# Patient Record
Sex: Male | Born: 1980 | Race: White | Hispanic: No | Marital: Married | Smoking: Former smoker
Health system: Southern US, Community
[De-identification: ages and names within clinical notes are randomized; demographics above are authoritative.]

## PROBLEM LIST (undated history)

## (undated) DIAGNOSIS — R748 Abnormal levels of other serum enzymes: Secondary | ICD-10-CM

## (undated) DIAGNOSIS — E119 Type 2 diabetes mellitus without complications: Secondary | ICD-10-CM

## (undated) DIAGNOSIS — R079 Chest pain, unspecified: Secondary | ICD-10-CM

## (undated) DIAGNOSIS — F320 Major depressive disorder, single episode, mild: Secondary | ICD-10-CM

## (undated) DIAGNOSIS — Z6841 Body Mass Index (BMI) 40.0 and over, adult: Secondary | ICD-10-CM

## (undated) HISTORY — DX: Chest pain, unspecified: R07.9

## (undated) HISTORY — DX: Major depressive disorder, single episode, mild: F32.0

## (undated) HISTORY — DX: Body Mass Index (BMI) 40.0 and over, adult: Z684

## (undated) HISTORY — DX: Abnormal levels of other serum enzymes: R74.8

## (undated) HISTORY — DX: Type 2 diabetes mellitus without complications: E11.9

---
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
## 2010-02-16 ENCOUNTER — Emergency Department (HOSPITAL_COMMUNITY)
Admission: EM | Admit: 2010-02-16 | Discharge: 2010-02-16 | Payer: Self-pay | Source: Home / Self Care | Admitting: Emergency Medicine

## 2015-07-15 DIAGNOSIS — Z1389 Encounter for screening for other disorder: Secondary | ICD-10-CM | POA: Diagnosis not present

## 2015-07-15 DIAGNOSIS — I10 Essential (primary) hypertension: Secondary | ICD-10-CM | POA: Diagnosis not present

## 2015-07-15 DIAGNOSIS — E782 Mixed hyperlipidemia: Secondary | ICD-10-CM | POA: Diagnosis not present

## 2015-07-15 DIAGNOSIS — E119 Type 2 diabetes mellitus without complications: Secondary | ICD-10-CM | POA: Diagnosis not present

## 2015-07-15 DIAGNOSIS — Z6841 Body Mass Index (BMI) 40.0 and over, adult: Secondary | ICD-10-CM | POA: Diagnosis not present

## 2015-07-24 DIAGNOSIS — R7989 Other specified abnormal findings of blood chemistry: Secondary | ICD-10-CM | POA: Diagnosis not present

## 2015-07-24 DIAGNOSIS — R748 Abnormal levels of other serum enzymes: Secondary | ICD-10-CM | POA: Diagnosis not present

## 2015-07-31 DIAGNOSIS — H52223 Regular astigmatism, bilateral: Secondary | ICD-10-CM | POA: Diagnosis not present

## 2015-11-10 DIAGNOSIS — E782 Mixed hyperlipidemia: Secondary | ICD-10-CM | POA: Diagnosis not present

## 2015-11-10 DIAGNOSIS — E7800 Pure hypercholesterolemia, unspecified: Secondary | ICD-10-CM | POA: Diagnosis not present

## 2015-11-10 DIAGNOSIS — E781 Pure hyperglyceridemia: Secondary | ICD-10-CM | POA: Diagnosis not present

## 2015-11-10 DIAGNOSIS — E119 Type 2 diabetes mellitus without complications: Secondary | ICD-10-CM | POA: Diagnosis not present

## 2015-11-18 DIAGNOSIS — I10 Essential (primary) hypertension: Secondary | ICD-10-CM | POA: Diagnosis not present

## 2015-11-18 DIAGNOSIS — E119 Type 2 diabetes mellitus without complications: Secondary | ICD-10-CM | POA: Diagnosis not present

## 2015-11-18 DIAGNOSIS — M4316 Spondylolisthesis, lumbar region: Secondary | ICD-10-CM | POA: Diagnosis not present

## 2015-11-18 DIAGNOSIS — R079 Chest pain, unspecified: Secondary | ICD-10-CM | POA: Diagnosis not present

## 2015-11-18 DIAGNOSIS — M545 Low back pain: Secondary | ICD-10-CM | POA: Diagnosis not present

## 2015-11-18 DIAGNOSIS — R748 Abnormal levels of other serum enzymes: Secondary | ICD-10-CM | POA: Diagnosis not present

## 2015-11-25 DIAGNOSIS — Z0000 Encounter for general adult medical examination without abnormal findings: Secondary | ICD-10-CM | POA: Diagnosis not present

## 2015-11-25 DIAGNOSIS — Z6841 Body Mass Index (BMI) 40.0 and over, adult: Secondary | ICD-10-CM | POA: Diagnosis not present

## 2015-11-26 DIAGNOSIS — Z6841 Body Mass Index (BMI) 40.0 and over, adult: Secondary | ICD-10-CM | POA: Insufficient documentation

## 2015-11-26 DIAGNOSIS — F320 Major depressive disorder, single episode, mild: Secondary | ICD-10-CM

## 2015-11-26 DIAGNOSIS — E119 Type 2 diabetes mellitus without complications: Secondary | ICD-10-CM | POA: Insufficient documentation

## 2015-11-26 DIAGNOSIS — R748 Abnormal levels of other serum enzymes: Secondary | ICD-10-CM | POA: Insufficient documentation

## 2015-11-26 DIAGNOSIS — R079 Chest pain, unspecified: Secondary | ICD-10-CM | POA: Insufficient documentation

## 2015-11-27 ENCOUNTER — Encounter (INDEPENDENT_AMBULATORY_CARE_PROVIDER_SITE_OTHER): Payer: Self-pay

## 2015-11-27 ENCOUNTER — Encounter: Payer: Self-pay | Admitting: Cardiology

## 2015-11-27 ENCOUNTER — Ambulatory Visit (INDEPENDENT_AMBULATORY_CARE_PROVIDER_SITE_OTHER): Payer: BLUE CROSS/BLUE SHIELD | Admitting: Cardiology

## 2015-11-27 VITALS — BP 122/58 | HR 80 | Ht 72.0 in | Wt 318.0 lb

## 2015-11-27 DIAGNOSIS — R0609 Other forms of dyspnea: Secondary | ICD-10-CM

## 2015-11-27 DIAGNOSIS — E782 Mixed hyperlipidemia: Secondary | ICD-10-CM | POA: Diagnosis not present

## 2015-11-27 DIAGNOSIS — I119 Hypertensive heart disease without heart failure: Secondary | ICD-10-CM | POA: Diagnosis not present

## 2015-11-27 DIAGNOSIS — Z8249 Family history of ischemic heart disease and other diseases of the circulatory system: Secondary | ICD-10-CM

## 2015-11-27 NOTE — Progress Notes (Signed)
Cardiology Office Note    Date:  11/27/2015   ID:  Bradley Hahn, DOB 12/22/1980, MRN [id number]  PCP:  Bradley Hahn,Bradley Hahn, Bradley Hahn  Cardiologist:   Bradley AlexanderKatarina Ahmari Duerson, MD   Chief complain: Dyspnea on exertion.  History of Present Illness:  Bradley Hahn is a 35 y.o. male with prior medical history hypertension hyperlipidemia, prediabetes, had a liver who is coming to complain of dyspnea on exertion. Patient has morbid obesity he weighs 320 pounds. He has gained about 50 pounds in the last 2 years and has noticed worsening functional capacity and worsening dyspnea on exertion especially when walking stairs. He gets profoundly short of breath and profusely sweating. He is also noticed problems with some dysfunction and has problems with erection. He is worried because his father had first MI in his late 2020s and eventually died at age of 37, he has an uncle was 37 years old and recently had myocardial infarction. Denies any chest pain palpitations or syncope. No claudications.     Past Medical History:  Diagnosis Date  . Abnormal levels of other serum enzymes   . Body mass index 40.0-44.9, adult (HCC)   . Major depressive disorder, single episode, mild (HCC)   . Nonspecific chest pain   . Type 2 diabetes mellitus without complications Bradley Hahn(HCC)     Past Surgical History:  Procedure Laterality Date  . CYSTECTOMY  1996    Current Medications: Outpatient Medications Prior to Visit  Medication Sig Dispense Refill  . dapagliflozin propanediol (FARXIGA) 5 MG TABS tablet Take 5 mg by mouth daily.    Marland Kitchen. lisinopril-hydrochlorothiazide (PRINZIDE,ZESTORETIC) 20-12.5 MG tablet Take 1 tablet by mouth daily.    . sertraline (ZOLOFT) 25 MG tablet Take 25 mg by mouth daily.     No facility-administered medications prior to visit.      Allergies:   Metformin and related   Social History   Social History  . Marital status: Married    Spouse name: N/A  . Number of children: N/A  . Years of  education: N/A   Social History Main Topics  . Smoking status: Former Smoker    Quit date: 08/02/2010  . Smokeless tobacco: Never Used  . Alcohol use No  . Drug use: Unknown  . Sexual activity: Not Asked   Other Topics Concern  . None   Social History Narrative  . None     Family History:  The patient's family history includes Diabetes type II in his mother; Heart attack in his father.  Father MI in 5220', died of MI 5342. GF died of MI, uncle had MI at 7445.  ROS:   Please see the history of present illness.    ROS All other systems reviewed and are negative.   PHYSICAL EXAM:   VS:  BP (!) 122/58   Pulse 80   Ht 6' (1.829 m)   Wt (!) 318 lb (144.2 kg)   BMI 43.13 kg/m    GEN: Well nourished, well developed, in no acute distress , Morbidly obese HEENT: normal  Neck: no JVD, carotid bruits, or masses Cardiac: RRR; no murmurs, rubs, or gallops,no edema  Respiratory:  clear to auscultation bilaterally, normal work of breathing GI: soft, nontender, nondistended, + BS MS: no deformity or atrophy , some varicose vein. Skin: warm and dry, no rash Neuro:  Alert and Oriented x 3, Strength and sensation are intact Psych: euthymic mood, full affect  Wt Readings from Last 3 Encounters:  11/27/15 (!) 318 lb (144.2 kg)  Studies/Labs Reviewed:   EKG:  EKG is ordered today.  The ekg ordered today demonstrates SR, normal ECG.   Recent Labs: No results found for requested labs within last 8760 hours.   Lipid Panel No results found for: CHOL, TRIG, HDL, CHOLHDL, VLDL, LDLCALC, LDLDIRECT  Additional studies/ records that were reviewed today include:     ASSESSMENT:    1. Hypertensive heart disease without CHF   2. Mixed hyperlipidemia   3. Family history of early CAD      PLAN:  In order of problems listed above:  1. Dyspnea on exertion - he has several risk factors including morbid obesity, strong family history of early CAD, hypertension hyperlipidemia and fatty  liver. We will schedule an exercise nuclear stress test to rule out ischemia. If his stress test is borderline or negative we will evaluate with coronary CT calcium score for risk stratification and treatment of borderline hyperlipidemia as he has a strong family history of premature CAD. 2. Hypertension - we will assess hypertensive response on exertion the stress test. 3. Hyperlipidemia we'll decide on treatment based on results of calcium scoring.    Medication Adjustments/Labs and Tests Ordered: Current medicines are reviewed at length with the patient today.  Concerns regarding medicines are outlined above.  Medication changes, Labs and Tests ordered today are listed in the Patient Instructions below. There are no Patient Instructions on file for this visit.   Signed, Bradley Alexander, MD  11/27/2015 10:11 AM    [facility] [address] [place], [place], [place]  [address] Phone: [phone]; Fax: [phone]

## 2015-11-27 NOTE — Patient Instructions (Signed)
Medication Instructions:   Your physician recommends that you continue on your current medications as directed. Please refer to the Current Medication list given to you today.    Testing/Procedures:  Your physician has requested that you have en exercise stress myoview. For further information please visit www.[email or web address]. Please follow instruction sheet, as given.    Follow-Up:  AT DR NELSON'S NEXT AVAILABLE APPOINTMENT       If you need a refill on your cardiac medications before your next appointment, please call your pharmacy.   

## 2015-12-08 ENCOUNTER — Telehealth (HOSPITAL_COMMUNITY): Payer: Self-pay | Admitting: *Deleted

## 2015-12-08 NOTE — Telephone Encounter (Signed)
Left message on voicemail in reference to upcoming appointment scheduled for 12/10/15. Phone number given for a call back so details instructions can be given.  Bradley Hahn, Chelsa Stout Jacqueline

## 2015-12-09 ENCOUNTER — Telehealth (HOSPITAL_COMMUNITY): Payer: Self-pay | Admitting: *Deleted

## 2015-12-09 NOTE — Telephone Encounter (Signed)
Patient given detailed instructions per Myocardial Perfusion Study Information Sheet for the test on  12/11/15 Patient notified to arrive 15 minutes early and that it is imperative to arrive on time for appointment to keep from having the test rescheduled.  If you need to cancel or reschedule your appointment, please call the office within 24 hours of your appointment. Failure to do so may result in a cancellation of your appointment, and a $50 no show fee. Patient verbalized understanding. Cori Henningsen Jacqueline    

## 2015-12-10 ENCOUNTER — Ambulatory Visit (HOSPITAL_COMMUNITY): Payer: BLUE CROSS/BLUE SHIELD | Attending: Cardiovascular Disease

## 2015-12-10 DIAGNOSIS — I10 Essential (primary) hypertension: Secondary | ICD-10-CM | POA: Insufficient documentation

## 2015-12-10 DIAGNOSIS — I119 Hypertensive heart disease without heart failure: Secondary | ICD-10-CM

## 2015-12-10 DIAGNOSIS — Z8249 Family history of ischemic heart disease and other diseases of the circulatory system: Secondary | ICD-10-CM | POA: Diagnosis not present

## 2015-12-10 DIAGNOSIS — E782 Mixed hyperlipidemia: Secondary | ICD-10-CM

## 2015-12-10 DIAGNOSIS — R0609 Other forms of dyspnea: Secondary | ICD-10-CM

## 2015-12-10 MED ORDER — TECHNETIUM TC 99M TETROFOSMIN IV KIT
32.8000 | PACK | Freq: Once | INTRAVENOUS | Status: AC | PRN
  Administered 2015-12-10: 32.8 via INTRAVENOUS
  Filled 2015-12-10: qty 33

## 2015-12-11 ENCOUNTER — Ambulatory Visit (HOSPITAL_COMMUNITY): Payer: BLUE CROSS/BLUE SHIELD | Attending: Cardiology

## 2015-12-11 LAB — MYOCARDIAL PERFUSION IMAGING
Estimated workload: 7.9 METS
Exercise duration (min): 6 min
Exercise duration (sec): 45 s
LV dias vol: 117 mL (ref 62–150)
LV sys vol: 50 mL
MPHR: 163 {beats}/min
Peak HR: 171 {beats}/min
Percent HR: 105 %
RATE: 0.38
RPE: 18
Rest HR: 72 {beats}/min
SDS: 5
SRS: 2
SSS: 7
TID: 0.9

## 2015-12-11 MED ORDER — TECHNETIUM TC 99M TETROFOSMIN IV KIT
32.4000 | PACK | Freq: Once | INTRAVENOUS | Status: AC | PRN
  Administered 2015-12-11: 32.4 via INTRAVENOUS
  Filled 2015-12-11: qty 33

## 2016-01-29 ENCOUNTER — Encounter: Payer: Self-pay | Admitting: Cardiology

## 2016-02-05 ENCOUNTER — Ambulatory Visit (INDEPENDENT_AMBULATORY_CARE_PROVIDER_SITE_OTHER): Payer: BLUE CROSS/BLUE SHIELD | Admitting: Cardiology

## 2016-02-05 ENCOUNTER — Encounter (INDEPENDENT_AMBULATORY_CARE_PROVIDER_SITE_OTHER): Payer: Self-pay

## 2016-02-05 ENCOUNTER — Encounter: Payer: Self-pay | Admitting: Cardiology

## 2016-02-05 VITALS — BP 116/66 | HR 74 | Ht 72.0 in | Wt 314.0 lb

## 2016-02-05 DIAGNOSIS — Z8249 Family history of ischemic heart disease and other diseases of the circulatory system: Secondary | ICD-10-CM

## 2016-02-05 DIAGNOSIS — R0609 Other forms of dyspnea: Secondary | ICD-10-CM

## 2016-02-05 DIAGNOSIS — I10 Essential (primary) hypertension: Secondary | ICD-10-CM

## 2016-02-05 DIAGNOSIS — E782 Mixed hyperlipidemia: Secondary | ICD-10-CM

## 2016-02-05 DIAGNOSIS — R0600 Dyspnea, unspecified: Secondary | ICD-10-CM

## 2016-02-05 NOTE — Patient Instructions (Signed)

## 2016-02-05 NOTE — Progress Notes (Signed)
Cardiology Office Note    Date:  02/05/2016   ID:  Bradley Hahn, DOB 03/08/1980, MRN [id number]  PCP:  Bradley Hahn,ERIN, Bradley Hahn  Cardiologist:   Bradley AlexanderKatarina Dimitrius Steedman, MD   Chief complain: Dyspnea on exertion.  History of Present Illness:  Bradley LeachKenneth Otoole is a 35 y.o. male with prior medical history hypertension hyperlipidemia, prediabetes, had a liver who is coming to complain of dyspnea on exertion. Patient has morbid obesity he weighs 320 pounds. He has gained about 50 pounds in the last 2 years and has noticed worsening functional capacity and worsening dyspnea on exertion especially when walking stairs. He gets profoundly short of breath and profusely sweating. He is also noticed problems with some dysfunction and has problems with erection. He is worried because his father had first MI in his late 5220s and eventually died at age of 37, he has an uncle was 37 years old and recently had myocardial infarction. Denies any chest pain palpitations or syncope. No claudications.  02/05/2016 - the patient is coming for test results. He underwent stress testing that showed LVEF of 57% and no prior infarct or ischemia. He was able to walk on a treadmill for 6 minutes and achieved 8 METS. He says that he continues to have exertional dyspnea but no chest pain dictation to her syncope.   Past Medical History:  Diagnosis Date  . Abnormal levels of other serum enzymes   . Body mass index 40.0-44.9, adult (HCC)   . Major depressive disorder, single episode, mild (HCC)   . Nonspecific chest pain   . Type 2 diabetes mellitus without complications [facility](HCC)     Past Surgical History:  Procedure Laterality Date  . CYSTECTOMY  1996    Current Medications: Outpatient Medications Prior to Visit  Medication Sig Dispense Refill  . dapagliflozin propanediol (FARXIGA) 5 MG TABS tablet Take 5 mg by mouth daily.    Marland Kitchen. lisinopril-hydrochlorothiazide (PRINZIDE,ZESTORETIC) 20-12.5 MG tablet Take 1 tablet by mouth  daily.    . sertraline (ZOLOFT) 25 MG tablet Take 25 mg by mouth daily.     No facility-administered medications prior to visit.      Allergies:   Metformin and related   Social History   Social History  . Marital status: Married    Spouse name: N/A  . Number of children: N/A  . Years of education: N/A   Social History Main Topics  . Smoking status: Former Smoker    Quit date: 08/02/2010  . Smokeless tobacco: Never Used  . Alcohol use No  . Drug use: No  . Sexual activity: Not Asked   Other Topics Concern  . None   Social History Narrative  . None     Family History:  The patient's family history includes Diabetes type II in his mother; Heart attack in his father.  Father MI in 4320', died of MI 7442. GF died of MI, uncle had MI at 2845.  ROS:   Please see the history of present illness.    ROS All other systems reviewed and are negative.   PHYSICAL EXAM:   VS:  BP 116/66   Pulse 74   Ht 6' (1.829 m)   Wt (!) 314 lb (142.4 kg)   SpO2 99%   BMI 42.59 kg/m    GEN: Well nourished, well developed, in no acute distress , Morbidly obese HEENT: normal  Neck: no JVD, carotid bruits, or masses Cardiac: RRR; no murmurs, rubs, or gallops,no edema  Respiratory:  clear to  auscultation bilaterally, normal work of breathing GI: soft, nontender, nondistended, + BS MS: no deformity or atrophy , some varicose vein. Skin: warm and dry, no rash Neuro:  Alert and Oriented x 3, Strength and sensation are intact Psych: euthymic mood, full affect  Wt Readings from Last 3 Encounters:  02/05/16 (!) 314 lb (142.4 kg)  11/27/15 (!) 318 lb (144.2 kg)      Studies/Labs Reviewed:   EKG:  EKG is ordered today.  The ekg ordered today demonstrates SR, normal ECG.   Recent Labs: No results found for requested labs within last 8760 hours.   Lipid Panel No results found for: CHOL, TRIG, HDL, CHOLHDL, VLDL, LDLCALC, LDLDIRECT  Additional studies/ records that were reviewed today include:      ASSESSMENT:    No diagnosis found.   PLAN:  In order of problems listed above:  1. Dyspnea on exertion - he has several risk factors including morbid obesity, strong family history of early CAD, hypertension hyperlipidemia and fatty liver. His exercise nuclear stress test was negative for prior infarct or ischemia. He was offered calcium score testing considering his significant family history of premature coronary artery disease including sudden cardiac death, however he wants to think about it and for now start intense exercise and diet. He will have his lipids checked by his primary care physician in January, he will send us those and based on the results we will decide if he starts tapping [place] or not. 2. Hypertension -controlled. He had borderline blood pressure response with stress test, this should improve with exercise and diet. 3. Hyperlipidemia - as above.  Medication Adjustments/Labs and Tests Ordered: Current medicines are reviewed at length with the patient today.  Concerns regarding medicines are outlined above.  Medication changes, Labs and Tests ordered today are listed in the Patient Instructions below. Patient Instructions  Medication Instructions:   Your physician recommends that you continue on your current medications as directed. Please refer to the Current Medication list given to you today.     Follow-Up:  Your physician wants you to follow-up in: ONE YEAR WITH DR Johnell ComingsNELSON You will receive a reminder letter in the mail two months in advance. If you don't receive a letter, please call our office to schedule the follow-up appointment.        If you need a refill on your cardiac medications before your next appointment, please call your pharmacy.      Signed, Bradley AlexanderKatarina Zeric Baranowski, MD  02/05/2016 2:37 PM    [facility] [address], [place], [place]  [address] Phone: [phone](336) [phone]; Fax: [phone]) [phone]

## 2016-03-28 DIAGNOSIS — R739 Hyperglycemia, unspecified: Secondary | ICD-10-CM | POA: Diagnosis not present

## 2016-03-28 DIAGNOSIS — E782 Mixed hyperlipidemia: Secondary | ICD-10-CM | POA: Diagnosis not present

## 2016-03-28 DIAGNOSIS — E7800 Pure hypercholesterolemia, unspecified: Secondary | ICD-10-CM | POA: Diagnosis not present

## 2016-03-28 DIAGNOSIS — E119 Type 2 diabetes mellitus without complications: Secondary | ICD-10-CM | POA: Diagnosis not present

## 2016-03-28 DIAGNOSIS — E781 Pure hyperglyceridemia: Secondary | ICD-10-CM | POA: Diagnosis not present

## 2016-03-28 DIAGNOSIS — F320 Major depressive disorder, single episode, mild: Secondary | ICD-10-CM | POA: Diagnosis not present

## 2016-03-30 DIAGNOSIS — F320 Major depressive disorder, single episode, mild: Secondary | ICD-10-CM | POA: Diagnosis not present

## 2016-03-30 DIAGNOSIS — E119 Type 2 diabetes mellitus without complications: Secondary | ICD-10-CM | POA: Diagnosis not present

## 2016-03-30 DIAGNOSIS — I10 Essential (primary) hypertension: Secondary | ICD-10-CM | POA: Diagnosis not present

## 2016-03-30 DIAGNOSIS — E782 Mixed hyperlipidemia: Secondary | ICD-10-CM | POA: Diagnosis not present

## 2016-04-05 ENCOUNTER — Telehealth: Payer: Self-pay | Admitting: Cardiology

## 2016-04-05 ENCOUNTER — Telehealth: Payer: Self-pay | Admitting: *Deleted

## 2016-04-05 ENCOUNTER — Encounter: Payer: Self-pay | Admitting: *Deleted

## 2016-04-05 DIAGNOSIS — E782 Mixed hyperlipidemia: Secondary | ICD-10-CM

## 2016-04-05 DIAGNOSIS — Z8249 Family history of ischemic heart disease and other diseases of the circulatory system: Secondary | ICD-10-CM

## 2016-04-05 MED ORDER — ROSUVASTATIN CALCIUM 10 MG PO TABS: 10.0000 mg | ORAL_TABLET | Freq: Every day | ORAL | 3 refills | Status: AC

## 2016-04-05 NOTE — Telephone Encounter (Signed)
Borderline HbA1c, exercise, weight loss and limited carbs would help.   Borderline ALT, if he agrees to taking rosuvastatin, please schedule a repeat lipid panel and CMP in 2 months.   Elevated LDL, low HDL, I would recommend rosuvastatin 10 mg po daily.     Notified the pt of scanned labs from his PCP that was sent to Dr Nelson to review and advise on.   Informed the pt of Dr Nelson's recommendations regarding these results.   Informed the pt that she recommends that he start taking rosuvastatin 10 mg po daily, and come in for a lipid panel and cmet in 2 months, for he had borderline ALT as well.   Confirmed the pharmacy of choice with the pt.  Scheduled the pt a lab appt for 06/06/16, as he requested this day, to recheck his lipids and a cmet.   Pt aware to come fasting to this lab appt.   Advised the pt that his hemoglobin a1c was borderline, and she recommends that he exercise, weight loss, and limit his carb intake.   Pt verbalized understanding and agrees with this plan.       

## 2016-04-05 NOTE — Telephone Encounter (Signed)
-----   Message from Lars MassonKatarina H Nelson, MD sent at 03/31/2016  3:41 PM EST ----- Borderline ALT, if he agrees to taking rosuvastatin, please schedule a repeat lipid panel and CMP in 2 months. Thank you. Aris LotKatarina

## 2016-04-05 NOTE — Telephone Encounter (Signed)
Borderline HbA1c, exercise, weight loss and limited carbs would help.   Borderline ALT, if he agrees to taking rosuvastatin, please schedule a repeat lipid panel and CMP in 2 months.   Elevated LDL, low HDL, I would recommend rosuvastatin 10 mg po daily.     Notified the pt of scanned labs from his PCP that was sent to Dr Delton SeeNelson to review and advise on.   Informed the pt of Dr Lindaann SloughNelson's recommendations regarding these results.   Informed the pt that she recommends that he start taking rosuvastatin 10 mg po daily, and come in for a lipid panel and cmet in 2 months, for he had borderline ALT as well.   Confirmed the pharmacy of choice with the pt.  Scheduled the pt a lab appt for 06/06/16, as he requested this day, to recheck his lipids and a cmet.   Pt aware to come fasting to this lab appt.   Advised the pt that his hemoglobin a1c was borderline, and she recommends that he exercise, weight loss, and limit his carb intake.   Pt verbalized understanding and agrees with this plan.

## 2016-04-05 NOTE — Telephone Encounter (Signed)
New Message      Pt states he is returning Ivy's call

## 2016-06-06 ENCOUNTER — Other Ambulatory Visit: Payer: BLUE CROSS/BLUE SHIELD | Admitting: *Deleted

## 2016-06-06 DIAGNOSIS — E782 Mixed hyperlipidemia: Secondary | ICD-10-CM | POA: Diagnosis not present

## 2016-06-06 DIAGNOSIS — Z8249 Family history of ischemic heart disease and other diseases of the circulatory system: Secondary | ICD-10-CM

## 2016-06-06 LAB — COMPREHENSIVE METABOLIC PANEL
ALT: 44 IU/L (ref 0–44)
AST: 23 IU/L (ref 0–40)
Albumin/Globulin Ratio: 1.8 (ref 1.2–2.2)
Albumin: 4.3 g/dL (ref 3.5–5.5)
Alkaline Phosphatase: 66 IU/L (ref 39–117)
BUN/Creatinine Ratio: 19 (ref 9–20)
BUN: 15 mg/dL (ref 6–20)
Bilirubin Total: 0.4 mg/dL (ref 0.0–1.2)
CO2: 23 mmol/L (ref 18–29)
Calcium: 9.1 mg/dL (ref 8.7–10.2)
Chloride: 100 mmol/L (ref 96–106)
Creatinine, Ser: 0.79 mg/dL (ref 0.76–1.27)
GFR calc Af Amer: 132 mL/min/{1.73_m2} (ref >59)
GFR calc non Af Amer: 114 mL/min/{1.73_m2} (ref >59)
Globulin, Total: 2.4 g/dL (ref 1.5–4.5)
Glucose: 108 mg/dL — ABNORMAL HIGH (ref 65–99)
Potassium: 4.8 mmol/L (ref 3.5–5.2)
Sodium: 139 mmol/L (ref 134–144)
Total Protein: 6.7 g/dL (ref 6.0–8.5)

## 2016-06-06 LAB — LIPID PANEL
Chol/HDL Ratio: 7.1 ratio — ABNORMAL HIGH (ref 0.0–5.0)
Cholesterol, Total: 170 mg/dL (ref 100–199)
HDL: 24 mg/dL — ABNORMAL LOW (ref >39)
LDL Calculated: 121 mg/dL — ABNORMAL HIGH (ref 0–99)
Triglycerides: 125 mg/dL (ref 0–149)
VLDL Cholesterol Cal: 25 mg/dL (ref 5–40)

## 2016-06-16 ENCOUNTER — Encounter: Payer: Self-pay | Admitting: *Deleted

## 2016-09-28 DIAGNOSIS — E782 Mixed hyperlipidemia: Secondary | ICD-10-CM | POA: Diagnosis not present

## 2016-09-28 DIAGNOSIS — I10 Essential (primary) hypertension: Secondary | ICD-10-CM | POA: Diagnosis not present

## 2016-09-28 DIAGNOSIS — E7800 Pure hypercholesterolemia, unspecified: Secondary | ICD-10-CM | POA: Diagnosis not present

## 2016-09-28 DIAGNOSIS — E781 Pure hyperglyceridemia: Secondary | ICD-10-CM | POA: Diagnosis not present

## 2016-09-28 DIAGNOSIS — E119 Type 2 diabetes mellitus without complications: Secondary | ICD-10-CM | POA: Diagnosis not present

## 2016-10-04 DIAGNOSIS — E119 Type 2 diabetes mellitus without complications: Secondary | ICD-10-CM | POA: Diagnosis not present

## 2016-10-04 DIAGNOSIS — F320 Major depressive disorder, single episode, mild: Secondary | ICD-10-CM | POA: Diagnosis not present

## 2016-10-04 DIAGNOSIS — E782 Mixed hyperlipidemia: Secondary | ICD-10-CM | POA: Diagnosis not present

## 2016-10-04 DIAGNOSIS — Z1389 Encounter for screening for other disorder: Secondary | ICD-10-CM | POA: Diagnosis not present

## 2016-10-04 DIAGNOSIS — I10 Essential (primary) hypertension: Secondary | ICD-10-CM | POA: Diagnosis not present

## 2016-10-10 DIAGNOSIS — H52223 Regular astigmatism, bilateral: Secondary | ICD-10-CM | POA: Diagnosis not present

## 2017-07-27 DIAGNOSIS — M79602 Pain in left arm: Secondary | ICD-10-CM | POA: Diagnosis not present

## 2017-07-27 DIAGNOSIS — M5125 Other intervertebral disc displacement, thoracolumbar region: Secondary | ICD-10-CM | POA: Diagnosis not present

## 2017-07-27 DIAGNOSIS — M4186 Other forms of scoliosis, lumbar region: Secondary | ICD-10-CM | POA: Diagnosis not present

## 2017-07-27 DIAGNOSIS — G8929 Other chronic pain: Secondary | ICD-10-CM | POA: Diagnosis not present

## 2017-07-27 DIAGNOSIS — M25519 Pain in unspecified shoulder: Secondary | ICD-10-CM | POA: Diagnosis not present

## 2017-07-27 DIAGNOSIS — M542 Cervicalgia: Secondary | ICD-10-CM | POA: Diagnosis not present

## 2017-07-27 DIAGNOSIS — I10 Essential (primary) hypertension: Secondary | ICD-10-CM | POA: Diagnosis not present

## 2017-07-27 DIAGNOSIS — M545 Low back pain: Secondary | ICD-10-CM | POA: Diagnosis not present

## 2017-07-27 DIAGNOSIS — Z888 Allergy status to other drugs, medicaments and biological substances status: Secondary | ICD-10-CM | POA: Diagnosis not present

## 2017-07-27 DIAGNOSIS — M79601 Pain in right arm: Secondary | ICD-10-CM | POA: Diagnosis not present

## 2017-07-27 DIAGNOSIS — M50323 Other cervical disc degeneration at C6-C7 level: Secondary | ICD-10-CM | POA: Diagnosis not present

## 2017-07-27 DIAGNOSIS — Z79899 Other long term (current) drug therapy: Secondary | ICD-10-CM | POA: Diagnosis not present

## 2017-07-27 DIAGNOSIS — E669 Obesity, unspecified: Secondary | ICD-10-CM | POA: Diagnosis not present

## 2017-07-27 DIAGNOSIS — M5136 Other intervertebral disc degeneration, lumbar region: Secondary | ICD-10-CM | POA: Diagnosis not present

## 2017-07-31 DIAGNOSIS — Z6841 Body Mass Index (BMI) 40.0 and over, adult: Secondary | ICD-10-CM | POA: Diagnosis not present

## 2017-07-31 DIAGNOSIS — E782 Mixed hyperlipidemia: Secondary | ICD-10-CM | POA: Diagnosis not present

## 2017-07-31 DIAGNOSIS — F320 Major depressive disorder, single episode, mild: Secondary | ICD-10-CM | POA: Diagnosis not present

## 2017-07-31 DIAGNOSIS — I10 Essential (primary) hypertension: Secondary | ICD-10-CM | POA: Diagnosis not present

## 2017-07-31 DIAGNOSIS — E119 Type 2 diabetes mellitus without complications: Secondary | ICD-10-CM | POA: Diagnosis not present

## 2017-09-07 DIAGNOSIS — Z72820 Sleep deprivation: Secondary | ICD-10-CM | POA: Diagnosis not present

## 2017-09-07 DIAGNOSIS — R0683 Snoring: Secondary | ICD-10-CM | POA: Diagnosis not present

## 2017-09-07 DIAGNOSIS — E669 Obesity, unspecified: Secondary | ICD-10-CM | POA: Diagnosis not present

## 2017-09-07 DIAGNOSIS — Z6841 Body Mass Index (BMI) 40.0 and over, adult: Secondary | ICD-10-CM | POA: Diagnosis not present

## 2017-10-12 DIAGNOSIS — G8929 Other chronic pain: Secondary | ICD-10-CM | POA: Diagnosis not present

## 2017-10-12 DIAGNOSIS — M542 Cervicalgia: Secondary | ICD-10-CM | POA: Diagnosis not present

## 2017-10-12 DIAGNOSIS — M545 Low back pain: Secondary | ICD-10-CM | POA: Diagnosis not present

## 2017-10-19 DIAGNOSIS — Z72820 Sleep deprivation: Secondary | ICD-10-CM | POA: Diagnosis not present

## 2017-10-19 DIAGNOSIS — G8929 Other chronic pain: Secondary | ICD-10-CM | POA: Diagnosis not present

## 2017-10-19 DIAGNOSIS — M542 Cervicalgia: Secondary | ICD-10-CM | POA: Diagnosis not present

## 2017-10-19 DIAGNOSIS — Z6841 Body Mass Index (BMI) 40.0 and over, adult: Secondary | ICD-10-CM | POA: Diagnosis not present

## 2017-10-19 DIAGNOSIS — M545 Low back pain: Secondary | ICD-10-CM | POA: Diagnosis not present

## 2017-10-26 DIAGNOSIS — M542 Cervicalgia: Secondary | ICD-10-CM | POA: Diagnosis not present

## 2017-10-26 DIAGNOSIS — M545 Low back pain: Secondary | ICD-10-CM | POA: Diagnosis not present

## 2017-10-26 DIAGNOSIS — G8929 Other chronic pain: Secondary | ICD-10-CM | POA: Diagnosis not present

## 2017-11-02 DIAGNOSIS — Z6841 Body Mass Index (BMI) 40.0 and over, adult: Secondary | ICD-10-CM | POA: Diagnosis not present

## 2017-11-02 DIAGNOSIS — G8929 Other chronic pain: Secondary | ICD-10-CM | POA: Diagnosis not present

## 2017-11-02 DIAGNOSIS — M542 Cervicalgia: Secondary | ICD-10-CM | POA: Diagnosis not present

## 2017-11-02 DIAGNOSIS — M545 Low back pain: Secondary | ICD-10-CM | POA: Diagnosis not present

## 2017-11-09 DIAGNOSIS — M542 Cervicalgia: Secondary | ICD-10-CM | POA: Diagnosis not present

## 2017-11-09 DIAGNOSIS — M545 Low back pain: Secondary | ICD-10-CM | POA: Diagnosis not present

## 2017-11-09 DIAGNOSIS — G8929 Other chronic pain: Secondary | ICD-10-CM | POA: Diagnosis not present

## 2017-11-16 DIAGNOSIS — G8929 Other chronic pain: Secondary | ICD-10-CM | POA: Diagnosis not present

## 2017-11-16 DIAGNOSIS — M542 Cervicalgia: Secondary | ICD-10-CM | POA: Diagnosis not present

## 2017-11-16 DIAGNOSIS — M545 Low back pain: Secondary | ICD-10-CM | POA: Diagnosis not present

## 2017-11-21 DIAGNOSIS — K7689 Other specified diseases of liver: Secondary | ICD-10-CM | POA: Diagnosis not present

## 2017-11-21 DIAGNOSIS — E7800 Pure hypercholesterolemia, unspecified: Secondary | ICD-10-CM | POA: Diagnosis not present

## 2017-11-21 DIAGNOSIS — E119 Type 2 diabetes mellitus without complications: Secondary | ICD-10-CM | POA: Diagnosis not present

## 2017-11-21 DIAGNOSIS — E782 Mixed hyperlipidemia: Secondary | ICD-10-CM | POA: Diagnosis not present

## 2017-11-24 DIAGNOSIS — I10 Essential (primary) hypertension: Secondary | ICD-10-CM | POA: Diagnosis not present

## 2017-11-24 DIAGNOSIS — E782 Mixed hyperlipidemia: Secondary | ICD-10-CM | POA: Diagnosis not present

## 2017-11-24 DIAGNOSIS — E119 Type 2 diabetes mellitus without complications: Secondary | ICD-10-CM | POA: Diagnosis not present

## 2017-11-24 DIAGNOSIS — R300 Dysuria: Secondary | ICD-10-CM | POA: Diagnosis not present

## 2017-11-24 DIAGNOSIS — F320 Major depressive disorder, single episode, mild: Secondary | ICD-10-CM | POA: Diagnosis not present

## 2017-11-24 DIAGNOSIS — J209 Acute bronchitis, unspecified: Secondary | ICD-10-CM | POA: Diagnosis not present

## 2018-01-07 DIAGNOSIS — N503 Cyst of epididymis: Secondary | ICD-10-CM | POA: Diagnosis not present

## 2018-01-07 DIAGNOSIS — N5082 Scrotal pain: Secondary | ICD-10-CM | POA: Diagnosis not present

## 2018-01-07 DIAGNOSIS — Z8249 Family history of ischemic heart disease and other diseases of the circulatory system: Secondary | ICD-10-CM | POA: Diagnosis not present

## 2018-01-07 DIAGNOSIS — Z825 Family history of asthma and other chronic lower respiratory diseases: Secondary | ICD-10-CM | POA: Diagnosis not present

## 2018-01-07 DIAGNOSIS — D72829 Elevated white blood cell count, unspecified: Secondary | ICD-10-CM | POA: Diagnosis not present

## 2018-01-07 DIAGNOSIS — R300 Dysuria: Secondary | ICD-10-CM | POA: Diagnosis not present

## 2018-01-07 DIAGNOSIS — R509 Fever, unspecified: Secondary | ICD-10-CM | POA: Diagnosis not present

## 2018-01-07 DIAGNOSIS — Z833 Family history of diabetes mellitus: Secondary | ICD-10-CM | POA: Diagnosis not present

## 2018-01-07 DIAGNOSIS — R309 Painful micturition, unspecified: Secondary | ICD-10-CM | POA: Diagnosis not present

## 2018-01-07 DIAGNOSIS — N451 Epididymitis: Secondary | ICD-10-CM | POA: Diagnosis not present

## 2018-01-07 DIAGNOSIS — I10 Essential (primary) hypertension: Secondary | ICD-10-CM | POA: Diagnosis not present

## 2018-01-07 DIAGNOSIS — M1990 Unspecified osteoarthritis, unspecified site: Secondary | ICD-10-CM | POA: Diagnosis not present

## 2018-01-07 DIAGNOSIS — N50812 Left testicular pain: Secondary | ICD-10-CM | POA: Diagnosis not present

## 2018-01-07 DIAGNOSIS — F329 Major depressive disorder, single episode, unspecified: Secondary | ICD-10-CM | POA: Diagnosis not present

## 2018-01-07 DIAGNOSIS — N5089 Other specified disorders of the male genital organs: Secondary | ICD-10-CM | POA: Diagnosis not present

## 2018-01-07 DIAGNOSIS — R82998 Other abnormal findings in urine: Secondary | ICD-10-CM | POA: Diagnosis not present

## 2018-02-15 IMAGING — NM NM MISC PROCEDURE
3 series · 18 of 18 positions shown · non-contrast
Comparison: none

[Series 1: wbr_s-proj_st stress_(id)_sa · 6.5mm · 6.51mm/px · 6 of 512 frames shown (1 of 2)]
[frame 43/512]
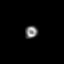
[frame 128/512]
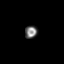
[frame 214/512]
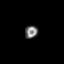
[frame 299/512]
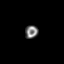
[frame 384/512]
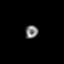
[frame 470/512]
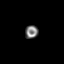

[Series 1: wbr_r-proj_st rest_(id)_sa · 6.5mm · 6.51mm/px · 6 of 64 frames shown]
[frame 6/64]
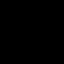
[frame 16/64]
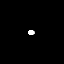
[frame 27/64]
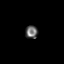
[frame 38/64]
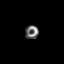
[frame 48/64]
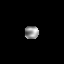
[frame 59/64]
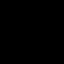

[Series 1: wbr_s-proj_st stress_(id)_sa · 6.5mm · 6.51mm/px · 6 of 64 frames shown (2 of 2)]
[frame 6/64]
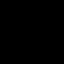
[frame 16/64]
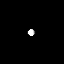
[frame 27/64]
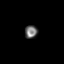
[frame 38/64]
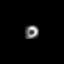
[frame 48/64]
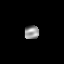
[frame 59/64]
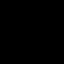

[18 of 18 positions shown; findings below may reference images not displayed]

Canned report from images found in remote index.

Refer to host system for actual result text.

## 2018-02-28 DIAGNOSIS — F329 Major depressive disorder, single episode, unspecified: Secondary | ICD-10-CM | POA: Diagnosis not present

## 2018-02-28 DIAGNOSIS — I10 Essential (primary) hypertension: Secondary | ICD-10-CM | POA: Diagnosis not present

## 2018-02-28 DIAGNOSIS — E119 Type 2 diabetes mellitus without complications: Secondary | ICD-10-CM | POA: Diagnosis not present

## 2018-02-28 DIAGNOSIS — Z87438 Personal history of other diseases of male genital organs: Secondary | ICD-10-CM | POA: Diagnosis not present

## 2018-02-28 DIAGNOSIS — Z87891 Personal history of nicotine dependence: Secondary | ICD-10-CM | POA: Diagnosis not present

## 2018-02-28 DIAGNOSIS — N390 Urinary tract infection, site not specified: Secondary | ICD-10-CM | POA: Diagnosis not present

## 2018-02-28 DIAGNOSIS — M1990 Unspecified osteoarthritis, unspecified site: Secondary | ICD-10-CM | POA: Diagnosis not present

## 2018-02-28 DIAGNOSIS — Z6841 Body Mass Index (BMI) 40.0 and over, adult: Secondary | ICD-10-CM | POA: Diagnosis not present

## 2018-02-28 DIAGNOSIS — Z87440 Personal history of urinary (tract) infections: Secondary | ICD-10-CM | POA: Diagnosis not present

## 2018-03-23 DIAGNOSIS — I10 Essential (primary) hypertension: Secondary | ICD-10-CM | POA: Diagnosis not present

## 2018-03-23 DIAGNOSIS — R748 Abnormal levels of other serum enzymes: Secondary | ICD-10-CM | POA: Diagnosis not present

## 2018-03-23 DIAGNOSIS — R739 Hyperglycemia, unspecified: Secondary | ICD-10-CM | POA: Diagnosis not present

## 2018-03-23 DIAGNOSIS — E782 Mixed hyperlipidemia: Secondary | ICD-10-CM | POA: Diagnosis not present

## 2018-03-23 DIAGNOSIS — E781 Pure hyperglyceridemia: Secondary | ICD-10-CM | POA: Diagnosis not present

## 2018-03-23 DIAGNOSIS — E119 Type 2 diabetes mellitus without complications: Secondary | ICD-10-CM | POA: Diagnosis not present

## 2018-03-27 DIAGNOSIS — E782 Mixed hyperlipidemia: Secondary | ICD-10-CM | POA: Diagnosis not present

## 2018-03-27 DIAGNOSIS — I10 Essential (primary) hypertension: Secondary | ICD-10-CM | POA: Diagnosis not present

## 2018-03-27 DIAGNOSIS — F320 Major depressive disorder, single episode, mild: Secondary | ICD-10-CM | POA: Diagnosis not present

## 2018-03-27 DIAGNOSIS — E119 Type 2 diabetes mellitus without complications: Secondary | ICD-10-CM | POA: Diagnosis not present

## 2018-06-28 DIAGNOSIS — R0981 Nasal congestion: Secondary | ICD-10-CM | POA: Diagnosis not present

## 2018-06-28 DIAGNOSIS — R509 Fever, unspecified: Secondary | ICD-10-CM | POA: Diagnosis not present

## 2018-06-28 DIAGNOSIS — R0602 Shortness of breath: Secondary | ICD-10-CM | POA: Diagnosis not present

## 2018-06-28 DIAGNOSIS — R6889 Other general symptoms and signs: Secondary | ICD-10-CM | POA: Diagnosis not present

## 2018-06-28 DIAGNOSIS — R05 Cough: Secondary | ICD-10-CM | POA: Diagnosis not present

## 2018-06-28 DIAGNOSIS — Z20828 Contact with and (suspected) exposure to other viral communicable diseases: Secondary | ICD-10-CM | POA: Diagnosis not present

## 2018-09-20 DIAGNOSIS — R5383 Other fatigue: Secondary | ICD-10-CM | POA: Diagnosis not present

## 2018-09-20 DIAGNOSIS — E119 Type 2 diabetes mellitus without complications: Secondary | ICD-10-CM | POA: Diagnosis not present

## 2018-09-20 DIAGNOSIS — E782 Mixed hyperlipidemia: Secondary | ICD-10-CM | POA: Diagnosis not present

## 2018-09-20 DIAGNOSIS — E781 Pure hyperglyceridemia: Secondary | ICD-10-CM | POA: Diagnosis not present

## 2018-09-20 DIAGNOSIS — K7689 Other specified diseases of liver: Secondary | ICD-10-CM | POA: Diagnosis not present

## 2018-09-25 DIAGNOSIS — E119 Type 2 diabetes mellitus without complications: Secondary | ICD-10-CM | POA: Diagnosis not present

## 2018-09-25 DIAGNOSIS — F320 Major depressive disorder, single episode, mild: Secondary | ICD-10-CM | POA: Diagnosis not present

## 2018-09-25 DIAGNOSIS — Z1331 Encounter for screening for depression: Secondary | ICD-10-CM | POA: Diagnosis not present

## 2018-09-25 DIAGNOSIS — E782 Mixed hyperlipidemia: Secondary | ICD-10-CM | POA: Diagnosis not present

## 2018-09-25 DIAGNOSIS — I10 Essential (primary) hypertension: Secondary | ICD-10-CM | POA: Diagnosis not present

## 2019-01-31 DIAGNOSIS — R1031 Right lower quadrant pain: Secondary | ICD-10-CM | POA: Diagnosis not present

## 2019-01-31 DIAGNOSIS — N200 Calculus of kidney: Secondary | ICD-10-CM | POA: Diagnosis not present

## 2019-01-31 DIAGNOSIS — R162 Hepatomegaly with splenomegaly, not elsewhere classified: Secondary | ICD-10-CM | POA: Diagnosis not present

## 2019-01-31 DIAGNOSIS — N132 Hydronephrosis with renal and ureteral calculous obstruction: Secondary | ICD-10-CM | POA: Diagnosis not present

## 2019-01-31 DIAGNOSIS — R1110 Vomiting, unspecified: Secondary | ICD-10-CM | POA: Diagnosis not present

## 2019-01-31 DIAGNOSIS — Z79899 Other long term (current) drug therapy: Secondary | ICD-10-CM | POA: Diagnosis not present

## 2019-01-31 DIAGNOSIS — F329 Major depressive disorder, single episode, unspecified: Secondary | ICD-10-CM | POA: Diagnosis not present

## 2019-01-31 DIAGNOSIS — Z87891 Personal history of nicotine dependence: Secondary | ICD-10-CM | POA: Diagnosis not present

## 2019-01-31 DIAGNOSIS — R109 Unspecified abdominal pain: Secondary | ICD-10-CM | POA: Diagnosis not present

## 2019-01-31 DIAGNOSIS — I10 Essential (primary) hypertension: Secondary | ICD-10-CM | POA: Diagnosis not present

## 2019-02-25 DIAGNOSIS — M5416 Radiculopathy, lumbar region: Secondary | ICD-10-CM | POA: Diagnosis not present

## 2019-03-01 DIAGNOSIS — M5416 Radiculopathy, lumbar region: Secondary | ICD-10-CM | POA: Diagnosis not present

## 2019-03-01 DIAGNOSIS — M4726 Other spondylosis with radiculopathy, lumbar region: Secondary | ICD-10-CM | POA: Diagnosis not present

## 2019-03-01 DIAGNOSIS — M48061 Spinal stenosis, lumbar region without neurogenic claudication: Secondary | ICD-10-CM | POA: Diagnosis not present

## 2019-03-25 DIAGNOSIS — R5383 Other fatigue: Secondary | ICD-10-CM | POA: Diagnosis not present

## 2019-03-25 DIAGNOSIS — E782 Mixed hyperlipidemia: Secondary | ICD-10-CM | POA: Diagnosis not present

## 2019-03-25 DIAGNOSIS — E7800 Pure hypercholesterolemia, unspecified: Secondary | ICD-10-CM | POA: Diagnosis not present

## 2019-03-25 DIAGNOSIS — K7689 Other specified diseases of liver: Secondary | ICD-10-CM | POA: Diagnosis not present

## 2019-03-25 DIAGNOSIS — E119 Type 2 diabetes mellitus without complications: Secondary | ICD-10-CM | POA: Diagnosis not present

## 2019-03-28 DIAGNOSIS — E119 Type 2 diabetes mellitus without complications: Secondary | ICD-10-CM | POA: Diagnosis not present

## 2019-03-28 DIAGNOSIS — F320 Major depressive disorder, single episode, mild: Secondary | ICD-10-CM | POA: Diagnosis not present

## 2019-03-28 DIAGNOSIS — I10 Essential (primary) hypertension: Secondary | ICD-10-CM | POA: Diagnosis not present

## 2019-03-28 DIAGNOSIS — E782 Mixed hyperlipidemia: Secondary | ICD-10-CM | POA: Diagnosis not present

## 2019-04-12 DIAGNOSIS — M5116 Intervertebral disc disorders with radiculopathy, lumbar region: Secondary | ICD-10-CM | POA: Diagnosis not present

## 2019-04-12 DIAGNOSIS — M419 Scoliosis, unspecified: Secondary | ICD-10-CM | POA: Diagnosis not present

## 2019-04-12 DIAGNOSIS — Z6841 Body Mass Index (BMI) 40.0 and over, adult: Secondary | ICD-10-CM | POA: Diagnosis not present

## 2019-04-12 DIAGNOSIS — M5416 Radiculopathy, lumbar region: Secondary | ICD-10-CM | POA: Diagnosis not present

## 2019-04-12 DIAGNOSIS — M5136 Other intervertebral disc degeneration, lumbar region: Secondary | ICD-10-CM | POA: Diagnosis not present

## 2019-04-12 DIAGNOSIS — M4186 Other forms of scoliosis, lumbar region: Secondary | ICD-10-CM | POA: Diagnosis not present

## 2019-04-16 DIAGNOSIS — F329 Major depressive disorder, single episode, unspecified: Secondary | ICD-10-CM | POA: Diagnosis not present

## 2019-04-16 DIAGNOSIS — M5126 Other intervertebral disc displacement, lumbar region: Secondary | ICD-10-CM | POA: Diagnosis not present

## 2019-04-16 DIAGNOSIS — M4186 Other forms of scoliosis, lumbar region: Secondary | ICD-10-CM | POA: Diagnosis not present

## 2019-04-16 DIAGNOSIS — E119 Type 2 diabetes mellitus without complications: Secondary | ICD-10-CM | POA: Diagnosis not present

## 2019-04-16 DIAGNOSIS — I272 Pulmonary hypertension, unspecified: Secondary | ICD-10-CM | POA: Diagnosis not present

## 2019-04-16 DIAGNOSIS — R29898 Other symptoms and signs involving the musculoskeletal system: Secondary | ICD-10-CM | POA: Diagnosis not present

## 2019-04-16 DIAGNOSIS — I10 Essential (primary) hypertension: Secondary | ICD-10-CM | POA: Diagnosis not present

## 2019-04-16 DIAGNOSIS — Z87891 Personal history of nicotine dependence: Secondary | ICD-10-CM | POA: Diagnosis not present

## 2019-04-16 DIAGNOSIS — M47816 Spondylosis without myelopathy or radiculopathy, lumbar region: Secondary | ICD-10-CM | POA: Diagnosis not present

## 2019-04-16 DIAGNOSIS — M4156 Other secondary scoliosis, lumbar region: Secondary | ICD-10-CM | POA: Diagnosis not present

## 2019-04-16 DIAGNOSIS — Z6841 Body Mass Index (BMI) 40.0 and over, adult: Secondary | ICD-10-CM | POA: Diagnosis not present

## 2019-04-16 DIAGNOSIS — M545 Low back pain: Secondary | ICD-10-CM | POA: Diagnosis not present

## 2019-04-16 DIAGNOSIS — M5137 Other intervertebral disc degeneration, lumbosacral region: Secondary | ICD-10-CM | POA: Diagnosis not present

## 2019-04-16 DIAGNOSIS — M5136 Other intervertebral disc degeneration, lumbar region: Secondary | ICD-10-CM | POA: Diagnosis not present

## 2019-04-16 DIAGNOSIS — G8929 Other chronic pain: Secondary | ICD-10-CM | POA: Diagnosis not present

## 2019-04-16 DIAGNOSIS — M48061 Spinal stenosis, lumbar region without neurogenic claudication: Secondary | ICD-10-CM | POA: Diagnosis not present

## 2019-11-25 DIAGNOSIS — E7800 Pure hypercholesterolemia, unspecified: Secondary | ICD-10-CM | POA: Diagnosis not present

## 2019-11-25 DIAGNOSIS — E781 Pure hyperglyceridemia: Secondary | ICD-10-CM | POA: Diagnosis not present

## 2019-11-25 DIAGNOSIS — K7689 Other specified diseases of liver: Secondary | ICD-10-CM | POA: Diagnosis not present

## 2019-11-25 DIAGNOSIS — E119 Type 2 diabetes mellitus without complications: Secondary | ICD-10-CM | POA: Diagnosis not present

## 2019-11-28 DIAGNOSIS — I10 Essential (primary) hypertension: Secondary | ICD-10-CM | POA: Diagnosis not present

## 2019-11-28 DIAGNOSIS — F320 Major depressive disorder, single episode, mild: Secondary | ICD-10-CM | POA: Diagnosis not present

## 2019-11-28 DIAGNOSIS — E119 Type 2 diabetes mellitus without complications: Secondary | ICD-10-CM | POA: Diagnosis not present

## 2019-11-28 DIAGNOSIS — E782 Mixed hyperlipidemia: Secondary | ICD-10-CM | POA: Diagnosis not present

## 2023-08-25 ENCOUNTER — Ambulatory Visit: Admission: EM | Admit: 2023-08-25 | Discharge: 2023-08-25 | Disposition: A | Discharge status: Home / Self Care

## 2023-08-25 ENCOUNTER — Other Ambulatory Visit: Payer: Self-pay

## 2023-08-25 ENCOUNTER — Encounter: Payer: Self-pay | Admitting: Emergency Medicine

## 2023-08-25 DIAGNOSIS — B9689 Other specified bacterial agents as the cause of diseases classified elsewhere: Secondary | ICD-10-CM

## 2023-08-25 DIAGNOSIS — L089 Local infection of the skin and subcutaneous tissue, unspecified: Secondary | ICD-10-CM | POA: Diagnosis not present

## 2023-08-25 MED ORDER — DOXYCYCLINE HYCLATE 100 MG PO TABS: 100.0000 mg | ORAL_TABLET | Freq: Two times a day (BID) | ORAL | 0 refills | Status: AC

## 2023-08-25 MED ORDER — CHLORHEXIDINE GLUCONATE 4 % EX SOLN: CUTANEOUS | 0 refills | Status: AC

## 2023-08-25 MED ORDER — MUPIROCIN 2 % EX OINT: 1.0000 | TOPICAL_OINTMENT | Freq: Two times a day (BID) | CUTANEOUS | 0 refills | Status: AC

## 2023-08-25 NOTE — Discharge Instructions (Signed)
 Take medication as prescribed. Warm compresses to the affected area 3-4 times daily. Keep the area covered if it begins to drain.  Also recommend leaving the area open to air when you are home. Seek care immediately if you experience new symptoms such as fever, chills, nausea, vomiting, or worsening of your current symptoms. Follow-up as needed.

## 2023-08-25 NOTE — ED Provider Notes (Signed)
 [facility]    CSN: [id number] Arrival date & time: 08/25/23  0836      History   Chief Complaint Chief Complaint  Patient presents with   Insect Bite    HPI Bradley Hahn is a 43 y.o. male.   The history is provided by the patient.   Patient presents for complaints of a possible spider bite to the left hip.  Patient states he noticed symptoms approximately 4 days ago.  Over the past several days, the area has had increased redness, swelling, and pain.  Patient denies fever, chills, foul-smelling drainage from the site, chest pain, abdominal pain, nausea, vomiting, or diarrhea.  Patient states that he has been applying topical antibiotic ointment to the area.  Patient reports he does have underlying history of diabetes.  States that he is on Mounjaro, and his diabetes is well-controlled.  Past Medical History:  Diagnosis Date   Abnormal levels of other serum enzymes    Body mass index 40.0-44.9, adult (HCC)    Major depressive disorder, single episode, mild (HCC)    Nonspecific chest pain    Type 2 diabetes mellitus without complications [facility])     Patient Active Problem List   Diagnosis Date Noted   Hypertensive heart disease without CHF 11/27/2015   Mixed hyperlipidemia 11/27/2015   Family history of early CAD 11/27/2015   Type 2 diabetes mellitus without complications (HCC)    Major depressive disorder, single episode, mild (HCC)    Nonspecific chest pain    Abnormal levels of other serum enzymes    Body mass index 40.0-44.9, adult (HCC)     Past Surgical History:  Procedure Laterality Date   CYSTECTOMY  1996   NECK SURGERY     neck fusion   SPINE SURGERY     spinal fusion       Home Medications    Prior to Admission medications   Medication Sig Start Date End Date Taking? Authorizing Provider  aspirin EC 81 MG tablet Take 81 mg by mouth 3 (three) times daily. Swallow whole.   Yes [provider]  chlorhexidine  (HIBICLENS ) 4 %  external liquid Apply a small amount of the solution to warm water and cleanse the affected area twice daily while symptoms persist. 08/25/23  Yes Leath-Warren, Etta PARAS, NP  doxycycline  (VIBRA -TABS) 100 MG tablet Take 1 tablet (100 mg total) by mouth 2 (two) times daily for 7 days. 08/25/23 09/01/23 Yes Leath-Warren, Etta PARAS, NP  MOUNJARO 10 MG/0.5ML Pen SMARTSIG:10 Milligram(s) SUB-Q Once a Week 08/19/23  Yes [provider]  mupirocin  ointment (BACTROBAN ) 2 % Apply 1 Application topically 2 (two) times daily. 08/25/23  Yes Leath-Warren, Etta PARAS, NP  atorvastatin (LIPITOR) 10 MG tablet Take 5 mg by mouth daily.    [provider]  buPROPion ER (WELLBUTRIN SR) 100 MG 12 hr tablet Take 100 mg by mouth 2 (two) times daily.    [provider]  dapagliflozin propanediol (FARXIGA) 5 MG TABS tablet Take 5 mg by mouth daily.    [provider]  fluticasone (FLONASE) 50 MCG/ACT nasal spray 1 spray.    [provider]  lisinopril-hydrochlorothiazide (PRINZIDE,ZESTORETIC) 20-12.5 MG tablet Take 1 tablet by mouth daily.    [provider]  losartan (COZAAR) 50 MG tablet Take 50 mg by mouth daily.    [provider]  methocarbamol (ROBAXIN) 750 MG tablet Take 750 mg by mouth 4 (four) times daily as needed.    [provider]  rosuvastatin  (CRESTOR ) 10 MG tablet Take 1 tablet (10 mg total) by mouth daily. 04/05/16 07/04/16  Maranda Leim DEL, MD  sertraline (ZOLOFT) 25 MG tablet Take 25 mg by mouth daily.    [provider]    Family History Family History  Problem Relation Age of Onset   Diabetes type II Mother    Heart attack Father     Social History Social History   Tobacco Use   Smoking status: Former    Current packs/day: 0.00    Types: Cigarettes    Quit date: 08/02/2010    Years since quitting: 13.0   Smokeless tobacco: Never  Substance Use Topics   Alcohol use: No   Drug use: No     Allergies    Metformin and related   Review of Systems Review of Systems Per HPI  Physical Exam Triage Vital Signs ED Triage Vitals [08/25/23 0855]  Encounter Vitals Group     BP 133/86     Girls Systolic BP Percentile      Girls Diastolic BP Percentile      Boys Systolic BP Percentile      Boys Diastolic BP Percentile      Pulse Rate 92     Resp 20     Temp 98.5 F (36.9 C)     Temp Source Oral     SpO2 97 %     Weight      Height      Head Circumference      Peak Flow      Pain Score 4     Pain Loc      Pain Education      Exclude from Growth Chart    No data found.  Updated Vital Signs BP 133/86 (BP Location: Right Arm)   Pulse 92   Temp 98.5 F (36.9 C) (Oral)   Resp 20   SpO2 97%   Visual Acuity Right Eye Distance:   Left Eye Distance:   Bilateral Distance:    Right Eye Near:   Left Eye Near:    Bilateral Near:     Physical Exam Vitals and nursing note reviewed.  Constitutional:      General: He is not in acute distress.    Appearance: Normal appearance.  HENT:     Head: Normocephalic.  Eyes:     Extraocular Movements: Extraocular movements intact.     Conjunctiva/sclera: Conjunctivae normal.     Pupils: Pupils are equal, round, and reactive to light.  Cardiovascular:     Rate and Rhythm: Normal rate and regular rhythm.     Pulses: Normal pulses.     Heart sounds: Normal heart sounds.  Pulmonary:     Effort: Pulmonary effort is normal. No respiratory distress.     Breath sounds: Normal breath sounds. No stridor. No wheezing, rhonchi or rales.  Abdominal:     General: Bowel sounds are normal.     Palpations: Abdomen is soft.  Musculoskeletal:     Cervical back: Normal range of motion.  Skin:    General: Skin is warm and dry.     Findings: Abscess present.      Neurological:     General: No focal deficit present.     Mental Status: He is alert and oriented to person, place, and time.  Psychiatric:        Mood and Affect: Mood normal.         Behavior: Behavior normal.  UC Treatments / Results  Labs (all labs ordered are listed, but only abnormal results are displayed) Labs Reviewed - No data to display  EKG   Radiology No results found.  Procedures Procedures (including critical care time)  Medications Ordered in UC Medications - No data to display  Initial Impression / Assessment and Plan / UC Course  I have reviewed the triage vital signs and the nursing notes.  Pertinent labs & imaging results that were available during my care of the patient were reviewed by me and considered in my medical decision making (see chart for details).  Patient with skin infection most likely transitioning to abscess.  Will treat with doxycycline  100 mg twice daily for the next 7 days, mupirocin  2% ointment to apply topically, and Hibiclens  4% solution to cleanse the affected area.  Supportive care recommendations were provided and discussed with the patient to include over-the-counter analgesics, use of warm compresses, and to monitor for signs of infection.  Discussed indications with patient regarding follow-up.  Patient was in agreement with this plan of care and verbalizes understanding.  All questions were answered.  Patient stable for discharge.  Final Clinical Impressions(s) / UC Diagnoses   Final diagnoses:  Skin infection, bacterial     Discharge Instructions      Take medication as prescribed. Warm compresses to the affected area 3-4 times daily. Keep the area covered if it begins to drain.  Also recommend leaving the area open to air when you are home. Seek care immediately if you experience new symptoms such as fever, chills, nausea, vomiting, or worsening of your current symptoms. Follow-up as needed.     ED Prescriptions     Medication Sig Dispense Auth. Provider   doxycycline  (VIBRA -TABS) 100 MG tablet Take 1 tablet (100 mg total) by mouth 2 (two) times daily for 7 days. 14 tablet Leath-Warren,  Etta PARAS, NP   mupirocin  ointment (BACTROBAN ) 2 % Apply 1 Application topically 2 (two) times daily. 30 g Leath-Warren, Etta PARAS, NP   chlorhexidine  (HIBICLENS ) 4 % external liquid Apply a small amount of the solution to warm water and cleanse the affected area twice daily while symptoms persist. 118 mL Leath-Warren, Etta PARAS, NP      PDMP not reviewed this encounter.   Gilmer Etta PARAS, NP 08/25/23 [id number]

## 2023-08-25 NOTE — ED Triage Notes (Signed)
 Pt reports possible spider bite x4 days to left hip.

## 2023-10-19 ENCOUNTER — Ambulatory Visit
Admission: EM | Admit: 2023-10-19 | Discharge: 2023-10-19 | Disposition: A | Discharge status: Home / Self Care | Attending: Family Medicine | Admitting: Family Medicine

## 2023-10-19 DIAGNOSIS — J340 Abscess, furuncle and carbuncle of nose: Secondary | ICD-10-CM | POA: Diagnosis not present

## 2023-10-19 MED ORDER — DOXYCYCLINE HYCLATE 100 MG PO CAPS: 100.0000 mg | ORAL_CAPSULE | Freq: Two times a day (BID) | ORAL | 0 refills | Status: AC

## 2023-10-19 NOTE — ED Provider Notes (Signed)
 [facility]    CSN: [id number] Arrival date & time: 10/19/23  1407      History   Chief Complaint No chief complaint on file.   HPI Bradley Hahn is a 43 y.o. male.   Patient presenting today with about a week of a sore to the right nostril that has become larger, more swollen and is draining.  Has been putting triple antibiotic on the area but states that it continues to get worse.  Denies fever, chills, known injury to the area, nausea, vomiting.    Past Medical History:  Diagnosis Date   Abnormal levels of other serum enzymes    Body mass index 40.0-44.9, adult (HCC)    Major depressive disorder, single episode, mild (HCC)    Nonspecific chest pain    Type 2 diabetes mellitus without complications [facility])     Patient Active Problem List   Diagnosis Date Noted   Hypertensive heart disease without CHF 11/27/2015   Mixed hyperlipidemia 11/27/2015   Family history of early CAD 11/27/2015   Type 2 diabetes mellitus without complications (HCC)    Major depressive disorder, single episode, mild (HCC)    Nonspecific chest pain    Abnormal levels of other serum enzymes    Body mass index 40.0-44.9, adult (HCC)     Past Surgical History:  Procedure Laterality Date   CYSTECTOMY  1996   NECK SURGERY     neck fusion   SPINE SURGERY     spinal fusion       Home Medications    Prior to Admission medications   Medication Sig Start Date End Date Taking? Authorizing Provider  doxycycline  (VIBRAMYCIN ) 100 MG capsule Take 1 capsule (100 mg total) by mouth 2 (two) times daily. 10/19/23  Yes Stuart Vernell Norris, PA-C  aspirin EC 81 MG tablet Take 81 mg by mouth 3 (three) times daily. Swallow whole.    [provider]  atorvastatin (LIPITOR) 10 MG tablet Take 5 mg by mouth daily.    [provider]  buPROPion ER (WELLBUTRIN SR) 100 MG 12 hr tablet Take 100 mg by mouth 2 (two) times daily.    [provider]  chlorhexidine  (HIBICLENS )  4 % external liquid Apply a small amount of the solution to warm water and cleanse the affected area twice daily while symptoms persist. 08/25/23   Leath-Warren, Etta PARAS, NP  dapagliflozin propanediol (FARXIGA) 5 MG TABS tablet Take 5 mg by mouth daily.    [provider]  fluticasone (FLONASE) 50 MCG/ACT nasal spray 1 spray.    [provider]  lisinopril-hydrochlorothiazide (PRINZIDE,ZESTORETIC) 20-12.5 MG tablet Take 1 tablet by mouth daily.    [provider]  losartan (COZAAR) 50 MG tablet Take 50 mg by mouth daily.    [provider]  methocarbamol (ROBAXIN) 750 MG tablet Take 750 mg by mouth 4 (four) times daily as needed.    [provider]  MOUNJARO 10 MG/0.5ML Pen SMARTSIG:10 Milligram(s) SUB-Q Once a Week 08/19/23   [provider]  mupirocin  ointment (BACTROBAN ) 2 % Apply 1 Application topically 2 (two) times daily. 08/25/23   Leath-Warren, Etta PARAS, NP  rosuvastatin  (CRESTOR ) 10 MG tablet Take 1 tablet (10 mg total) by mouth daily. 04/05/16 07/04/16  Maranda Leim DEL, MD  sertraline (ZOLOFT) 25 MG tablet Take 25 mg by mouth daily.    [provider]    Family History Family History  Problem Relation Age of Onset   Diabetes type II  Mother    Heart attack Father     Social History Social History   Tobacco Use   Smoking status: Former    Current packs/day: 0.00    Types: Cigarettes    Quit date: 08/02/2010    Years since quitting: 13.2   Smokeless tobacco: Never  Substance Use Topics   Alcohol use: No   Drug use: No     Allergies   Metformin and related   Review of Systems Review of Systems Per HPI  Physical Exam Triage Vital Signs ED Triage Vitals [10/19/23 1430]  Encounter Vitals Group     BP (!) 142/74     Girls Systolic BP Percentile      Girls Diastolic BP Percentile      Boys Systolic BP Percentile      Boys Diastolic BP Percentile      Pulse Rate 86     Resp 18     Temp 98.3 F (36.8  C)     Temp Source Oral     SpO2 96 %     Weight      Height      Head Circumference      Peak Flow      Pain Score 2     Pain Loc      Pain Education      Exclude from Growth Chart    No data found.  Updated Vital Signs BP (!) 142/74 (BP Location: Right Arm)   Pulse 86   Temp 98.3 F (36.8 C) (Oral)   Resp 18   SpO2 96%   Visual Acuity Right Eye Distance:   Left Eye Distance:   Bilateral Distance:    Right Eye Near:   Left Eye Near:    Bilateral Near:     Physical Exam Vitals and nursing note reviewed.  Constitutional:      Appearance: Normal appearance.  HENT:     Head: Atraumatic.     Nose: Nose normal.     Comments: Right nasal fold erythematous, edematous, tender to palpation    Mouth/Throat:     Mouth: Mucous membranes are moist.  Eyes:     Extraocular Movements: Extraocular movements intact.     Conjunctiva/sclera: Conjunctivae normal.  Cardiovascular:     Rate and Rhythm: Normal rate.  Pulmonary:     Effort: Pulmonary effort is normal.  Musculoskeletal:        General: Normal range of motion.     Cervical back: Normal range of motion and neck supple.  Skin:    General: Skin is warm and dry.  Neurological:     Mental Status: He is oriented to person, place, and time.  Psychiatric:        Mood and Affect: Mood normal.        Thought Content: Thought content normal.        Judgment: Judgment normal.      UC Treatments / Results  Labs (all labs ordered are listed, but only abnormal results are displayed) Labs Reviewed - No data to display  EKG   Radiology No results found.  Procedures Procedures (including critical care time)  Medications Ordered in UC Medications - No data to display  Initial Impression / Assessment and Plan / UC Course  I have reviewed the triage vital signs and the nursing notes.  Pertinent labs & imaging results that were available during my care of the patient were reviewed by me and considered in my medical  decision making (see chart for details).     Treat with doxycycline , Neosporin, warm compresses, over-the-counter pain relievers as needed.  Follow-up for worsening or unresolving symptoms.  Final Clinical Impressions(s) / UC Diagnoses   Final diagnoses:  Nasal abscess   Discharge Instructions   None    ED Prescriptions     Medication Sig Dispense Auth. Provider   doxycycline  (VIBRAMYCIN ) 100 MG capsule Take 1 capsule (100 mg total) by mouth 2 (two) times daily. 14 capsule Stuart Vernell Norris, [place]      PDMP not reviewed this encounter.   Stuart Vernell Norris, [place] 10/19/23 1528

## 2023-10-19 NOTE — ED Triage Notes (Signed)
 Pt reports sore in the nose, on the right nostril x 1 week, per patient if feels infected. Has been using antibiotic ointments on affected area. Denies know injury to the site. Pain in the cheek bone.
